# Patient Record
Sex: Male | Born: 1985 | Race: Black or African American | Hispanic: No | Marital: Married | Smoking: Current some day smoker
Health system: Southern US, Community
[De-identification: ages and names within clinical notes are randomized; demographics above are authoritative.]

---

## 2005-10-06 ENCOUNTER — Emergency Department (HOSPITAL_COMMUNITY): Admission: EM | Admit: 2005-10-06 | Discharge: 2005-10-06 | Payer: Self-pay | Admitting: Emergency Medicine

## 2014-01-20 ENCOUNTER — Encounter (HOSPITAL_COMMUNITY): Payer: Self-pay

## 2014-01-20 ENCOUNTER — Emergency Department (INDEPENDENT_AMBULATORY_CARE_PROVIDER_SITE_OTHER)
Admission: EM | Admit: 2014-01-20 | Discharge: 2014-01-20 | Disposition: A | Discharge status: Home / Self Care | Payer: Self-pay | Source: Home / Self Care | Attending: Emergency Medicine | Admitting: Emergency Medicine

## 2014-01-20 DIAGNOSIS — S46811A Strain of other muscles, fascia and tendons at shoulder and upper arm level, right arm, initial encounter: Secondary | ICD-10-CM

## 2014-01-20 MED ORDER — DICLOFENAC SODIUM 50 MG PO TBEC: 50.0000 mg | DELAYED_RELEASE_TABLET | Freq: Two times a day (BID) | ORAL | Status: DC

## 2014-01-20 NOTE — Discharge Instructions (Signed)
No sports or weight training x 4-5 days.   Muscle Strain A muscle strain is an injury that occurs when a muscle is stretched beyond its normal length. Usually a small number of muscle fibers are torn when this happens. Muscle strain is rated in degrees. First-degree strains have the least amount of muscle fiber tearing and pain. Second-degree and third-degree strains have increasingly more tearing and pain.  Usually, recovery from muscle strain takes 1-2 weeks. Complete healing takes 5-6 weeks.  CAUSES  Muscle strain happens when a sudden, violent force placed on a muscle stretches it too far. This may occur with lifting, sports, or a fall.  RISK FACTORS Muscle strain is especially common in athletes.  SIGNS AND SYMPTOMS At the site of the muscle strain, there may be:  Pain.  Bruising.  Swelling.  Difficulty using the muscle due to pain or lack of normal function. DIAGNOSIS  Your health care provider will perform a physical exam and ask about your medical history. TREATMENT  Often, the best treatment for a muscle strain is resting, icing, and applying cold compresses to the injured area.  HOME CARE INSTRUCTIONS   Use the PRICE method of treatment to promote muscle healing during the first 2-3 days after your injury. The PRICE method involves:  Protecting the muscle from being injured again.  Restricting your activity and resting the injured body part.  Icing your injury. To do this, put ice in a plastic bag. Place a towel between your skin and the bag. Then, apply the ice and leave it on from 15-20 minutes each hour. After the third day, switch to moist heat packs.  Apply compression to the injured area with a splint or elastic bandage. Be careful not to wrap it too tightly. This may interfere with blood circulation or increase swelling.  Elevate the injured body part above the level of your heart as often as you can.  Only take over-the-counter or prescription medicines for  pain, discomfort, or fever as directed by your health care provider.  Warming up prior to exercise helps to prevent future muscle strains. SEEK MEDICAL CARE IF:   You have increasing pain or swelling in the injured area.  You have numbness, tingling, or a significant loss of strength in the injured area. MAKE SURE YOU:   Understand these instructions.  Will watch your condition.  Will get help right away if you are not doing well or get worse. Document Released: 02/07/2005 Document Revised: 11/28/2012 Document Reviewed: 09/06/2012 [facility] Patient Information 2015 [place], [place]. This information is not intended to replace advice given to you by your health care provider. Make sure you discuss any questions you have with your health care provider.

## 2014-01-20 NOTE — ED Provider Notes (Signed)
CSN: [id number]     Arrival date & time 01/20/14  [id number] History   First MD Initiated Contact with Patient 01/20/14 [phone]     Chief Complaint  Patient presents with  . Shoulder Pain   (Consider location/radiation/quality/duration/timing/severity/associated sxs/prior Treatment) HPI Comments: Patient presents for evaluation of right lateral neck and shoulder discomfort that began about one week ago. Patient is right hand dominant and is currently unemployed. Works out at gym daily and often plays recreational basketball but does not recall specific injury. Has taken 1-2 doses of Aleve and has used occasional topical [employer] product. No changes in strength or sensation of either upper extremity. No midline tenderness, fever or rash.   The history is provided by the patient.    History reviewed. No pertinent past medical history. History reviewed. No pertinent past surgical history. History reviewed. No pertinent family history. History  Substance Use Topics  . Smoking status: Current Every Day Smoker  . Smokeless tobacco: Not on file  . Alcohol Use: Yes    Review of Systems  All other systems reviewed and are negative.   Allergies  Review of patient's allergies indicates no known allergies.  Home Medications   Prior to Admission medications   Medication Sig Start Date End Date Taking? Authorizing Provider  diclofenac (VOLTAREN) 50 MG EC tablet Take 1 tablet (50 mg total) by mouth 2 (two) times daily. As needed for pain 01/20/14   Jess BartersJennifer Lee H Presson, PA   BP 127/82 mmHg  Pulse 55  Temp(Src) 98.2 F (36.8 C) (Oral)  Resp 12  SpO2 100% Physical Exam  Constitutional: He is oriented to person, place, and time. He appears well-developed and well-nourished. No distress.  HENT:  Head: Normocephalic and atraumatic.  Right Ear: Hearing and external ear normal.  Left Ear: Hearing and external ear normal.  Nose: Nose normal.  Eyes: Conjunctivae are normal.  Neck: Trachea  normal, normal range of motion and phonation normal. Neck supple. Muscular tenderness present. No thyroid mass and no thyromegaly present.    CSM exam of RUE intact.  Cardiovascular: Normal rate, regular rhythm and normal heart sounds.   Pulmonary/Chest: Effort normal and breath sounds normal.  Musculoskeletal: Normal range of motion.  Neurological: He is alert and oriented to person, place, and time.  Skin: Skin is warm and dry. No rash noted. No erythema.  Psychiatric: He has a normal mood and affect. His behavior is normal.  Nursing note and vitals reviewed.   ED Course  Procedures (including critical care time) Labs Review Labs Reviewed - No data to display  Imaging Review No results found.   MDM   1. Trapezius muscle strain, right, initial encounter    Warm compresses. NSAIDs. No sports or weight training for 4-5 days. Expect improvement over next 4-5 days    Ria ClockJennifer Lee H Presson, GeorgiaPA 01/20/14 [phone]

## 2014-01-20 NOTE — ED Notes (Signed)
PA eval only 

## 2014-11-06 ENCOUNTER — Encounter (HOSPITAL_COMMUNITY): Payer: Self-pay | Admitting: *Deleted

## 2014-11-06 ENCOUNTER — Emergency Department (HOSPITAL_COMMUNITY): Payer: Self-pay

## 2014-11-06 ENCOUNTER — Emergency Department (HOSPITAL_COMMUNITY)
Admission: EM | Admit: 2014-11-06 | Discharge: 2014-11-06 | Disposition: A | Discharge status: Home / Self Care | Payer: Self-pay | Attending: Emergency Medicine | Admitting: Emergency Medicine

## 2014-11-06 DIAGNOSIS — Y9389 Activity, other specified: Secondary | ICD-10-CM | POA: Insufficient documentation

## 2014-11-06 DIAGNOSIS — T07XXXA Unspecified multiple injuries, initial encounter: Secondary | ICD-10-CM

## 2014-11-06 DIAGNOSIS — Y9289 Other specified places as the place of occurrence of the external cause: Secondary | ICD-10-CM | POA: Insufficient documentation

## 2014-11-06 DIAGNOSIS — S60511A Abrasion of right hand, initial encounter: Secondary | ICD-10-CM | POA: Insufficient documentation

## 2014-11-06 DIAGNOSIS — Z87891 Personal history of nicotine dependence: Secondary | ICD-10-CM | POA: Insufficient documentation

## 2014-11-06 DIAGNOSIS — Y998 Other external cause status: Secondary | ICD-10-CM | POA: Insufficient documentation

## 2014-11-06 DIAGNOSIS — T1490XA Injury, unspecified, initial encounter: Secondary | ICD-10-CM

## 2014-11-06 DIAGNOSIS — Z23 Encounter for immunization: Secondary | ICD-10-CM | POA: Insufficient documentation

## 2014-11-06 DIAGNOSIS — W2209XA Striking against other stationary object, initial encounter: Secondary | ICD-10-CM | POA: Insufficient documentation

## 2014-11-06 MED ORDER — TRAMADOL HCL 50 MG PO TABS
50.0000 mg | ORAL_TABLET | Freq: Two times a day (BID) | ORAL | Status: AC | PRN
Start: 2014-11-06 — End: ?

## 2014-11-06 MED ORDER — KETOROLAC TROMETHAMINE 60 MG/2ML IM SOLN
60.0000 mg | Freq: Once | INTRAMUSCULAR | Status: AC
  Administered 2014-11-06: 60 mg via INTRAMUSCULAR
  Filled 2014-11-06: qty 2

## 2014-11-06 MED ORDER — TETANUS-DIPHTH-ACELL PERTUSSIS 5-2.5-18.5 LF-MCG/0.5 IM SUSP
0.5000 mL | Freq: Once | INTRAMUSCULAR | Status: AC
  Administered 2014-11-06: 0.5 mL via INTRAMUSCULAR
  Filled 2014-11-06: qty 0.5

## 2014-11-06 NOTE — ED Notes (Signed)
Ice pack given to pt.
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
## 2014-11-06 NOTE — Discharge Instructions (Signed)
Abrasions Duane Phillips, your xray does not show any fractures or dislocations.  Take ibuprofen at home for pain. If the pain becomes severe, take one tramadol.  See a primary care doctor within 3 days for close follow up.  If symptoms worsen, come back to the ED immediately. Thank you. An abrasion is a cut or scrape of the skin. Abrasions do not go through all layers of the skin. HOME CARE  If a bandage (dressing) was put on your wound, change it as told by your doctor. If the bandage sticks, soak it off with warm.  Wash the area with water and soap 2 times a day. Rinse off the soap. Pat the area dry with a clean towel.  Put on medicated cream (ointment) as told by your doctor.  Change your bandage right away if it gets wet or dirty.  Only take medicine as told by your doctor.  See your doctor within 24-48 hours to get your wound checked.  Check your wound for redness, puffiness (swelling), or yellowish-white fluid (pus). GET HELP RIGHT AWAY IF:   You have more pain in the wound.  You have redness, swelling, or tenderness around the wound.  You have pus coming from the wound.  You have a fever or lasting symptoms for more than 2-3 days.  You have a fever and your symptoms suddenly get worse.  You have a bad smell coming from the wound or bandage. MAKE SURE YOU:   Understand these instructions.  Will watch your condition.  Will get help right away if you are not doing well or get worse. Document Released: 07/27/2007 Document Revised: 11/02/2011 Document Reviewed: 01/11/2011 [facility] Patient Information 2015 [place], [place]. This information is not intended to replace advice given to you by your health care provider. Make sure you discuss any questions you have with your health care provider. RICE: Routine Care for Injuries Rest, Ice, Compression, and Elevation (RICE) are often used to care for injuries. HOME CARE  Rest your injury.  Put ice on the injury.  Put ice in a  plastic bag.  Place a towel between your skin and the bag.  Leave the ice on for 15-20 minutes, 03-04 times a day. Do this for as long as told by your doctor.  Apply pressure (compression) with an elastic bandage. Remove and reapply the bandage every 3 to 4 hours. Do not wrap the bandage too tight. Wrap the bandage looser if the fingers or toes are puffy (swollen), blue, cold, painful, or lose feeling (numb).  Raise (elevate) your injury. Raise your injury above the heart if you can. GET HELP RIGHT AWAY IF:  You have lasting pain or puffiness.  Your injury is red, weak, or loses feeling.  Your problems get worse, not better, after several days. MAKE SURE YOU:  Understand these instructions.  Will watch your condition.  Will get help right away if you are not doing well or get worse. Document Released: 07/27/2007 Document Revised: 05/02/2011 Document Reviewed: 07/09/2010 [facility] Patient Information 2015 [place], [place]. This information is not intended to replace advice given to you by your health care provider. Make sure you discuss any questions you have with your health care provider.

## 2015-01-17 ENCOUNTER — Emergency Department (HOSPITAL_COMMUNITY): Payer: No Typology Code available for payment source

## 2015-01-17 ENCOUNTER — Emergency Department (HOSPITAL_COMMUNITY)
Admission: EM | Admit: 2015-01-17 | Discharge: 2015-01-17 | Disposition: A | Discharge status: Home / Self Care | Payer: No Typology Code available for payment source | Attending: Emergency Medicine | Admitting: Emergency Medicine

## 2015-01-17 ENCOUNTER — Encounter (HOSPITAL_COMMUNITY): Payer: Self-pay | Admitting: Emergency Medicine

## 2015-01-17 DIAGNOSIS — Y92410 Unspecified street and highway as the place of occurrence of the external cause: Secondary | ICD-10-CM | POA: Diagnosis not present

## 2015-01-17 DIAGNOSIS — S60412A Abrasion of right middle finger, initial encounter: Secondary | ICD-10-CM | POA: Diagnosis not present

## 2015-01-17 DIAGNOSIS — F17200 Nicotine dependence, unspecified, uncomplicated: Secondary | ICD-10-CM | POA: Diagnosis not present

## 2015-01-17 DIAGNOSIS — S199XXA Unspecified injury of neck, initial encounter: Secondary | ICD-10-CM | POA: Insufficient documentation

## 2015-01-17 DIAGNOSIS — S90511A Abrasion, right ankle, initial encounter: Secondary | ICD-10-CM | POA: Insufficient documentation

## 2015-01-17 DIAGNOSIS — Y9389 Activity, other specified: Secondary | ICD-10-CM | POA: Insufficient documentation

## 2015-01-17 DIAGNOSIS — Y998 Other external cause status: Secondary | ICD-10-CM | POA: Insufficient documentation

## 2015-01-17 DIAGNOSIS — S8991XA Unspecified injury of right lower leg, initial encounter: Secondary | ICD-10-CM | POA: Diagnosis not present

## 2015-01-17 DIAGNOSIS — S80811A Abrasion, right lower leg, initial encounter: Secondary | ICD-10-CM | POA: Diagnosis not present

## 2015-01-17 MED ORDER — OXYCODONE-ACETAMINOPHEN 5-325 MG PO TABS
1.0000 | ORAL_TABLET | Freq: Once | ORAL | Status: AC
  Administered 2015-01-17: 1 via ORAL
  Filled 2015-01-17: qty 1

## 2015-01-17 MED ORDER — CYCLOBENZAPRINE HCL 10 MG PO TABS: 10.0000 mg | ORAL_TABLET | Freq: Two times a day (BID) | ORAL | Status: AC | PRN

## 2015-01-17 MED ORDER — KETOROLAC TROMETHAMINE 60 MG/2ML IM SOLN
60.0000 mg | Freq: Once | INTRAMUSCULAR | Status: AC
  Administered 2015-01-17: 60 mg via INTRAMUSCULAR
  Filled 2015-01-17: qty 2

## 2015-01-17 MED ORDER — IBUPROFEN 400 MG PO TABS: 400.0000 mg | ORAL_TABLET | Freq: Three times a day (TID) | ORAL | Status: AC
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
## 2015-01-17 NOTE — ED Notes (Signed)
Pt returned from radiology.
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
